# Patient Record
Sex: Female | Born: 1950 | Race: White | Hispanic: No | Marital: Single
Health system: Southern US, Community
[De-identification: ages and names within clinical notes are randomized; demographics above are authoritative.]

## PROBLEM LIST (undated history)

## (undated) DIAGNOSIS — F32A Depression, unspecified: Secondary | ICD-10-CM

## (undated) DIAGNOSIS — I10 Essential (primary) hypertension: Secondary | ICD-10-CM

## (undated) DIAGNOSIS — J449 Chronic obstructive pulmonary disease, unspecified: Secondary | ICD-10-CM

## (undated) DIAGNOSIS — J45909 Unspecified asthma, uncomplicated: Secondary | ICD-10-CM

## (undated) DIAGNOSIS — F449 Dissociative and conversion disorder, unspecified: Secondary | ICD-10-CM

## (undated) DIAGNOSIS — E669 Obesity, unspecified: Secondary | ICD-10-CM

## (undated) DIAGNOSIS — F419 Anxiety disorder, unspecified: Secondary | ICD-10-CM

## (undated) DIAGNOSIS — I2699 Other pulmonary embolism without acute cor pulmonale: Secondary | ICD-10-CM

## (undated) DIAGNOSIS — G2581 Restless legs syndrome: Secondary | ICD-10-CM

## (undated) DIAGNOSIS — E785 Hyperlipidemia, unspecified: Secondary | ICD-10-CM

## (undated) DIAGNOSIS — F329 Major depressive disorder, single episode, unspecified: Secondary | ICD-10-CM

## (undated) DIAGNOSIS — Z950 Presence of cardiac pacemaker: Secondary | ICD-10-CM

## (undated) DIAGNOSIS — D649 Anemia, unspecified: Secondary | ICD-10-CM

## (undated) DIAGNOSIS — I9589 Other hypotension: Secondary | ICD-10-CM

## (undated) DIAGNOSIS — M1990 Unspecified osteoarthritis, unspecified site: Secondary | ICD-10-CM

## (undated) DIAGNOSIS — K760 Fatty (change of) liver, not elsewhere classified: Secondary | ICD-10-CM

## (undated) DIAGNOSIS — R269 Unspecified abnormalities of gait and mobility: Secondary | ICD-10-CM

---

## 2017-08-19 DIAGNOSIS — F418 Other specified anxiety disorders: Secondary | ICD-10-CM | POA: Diagnosis not present

## 2017-08-19 DIAGNOSIS — Z950 Presence of cardiac pacemaker: Secondary | ICD-10-CM | POA: Diagnosis not present

## 2017-08-19 DIAGNOSIS — I10 Essential (primary) hypertension: Secondary | ICD-10-CM

## 2017-08-19 DIAGNOSIS — R000 Tachycardia, unspecified: Secondary | ICD-10-CM | POA: Diagnosis not present

## 2017-08-19 DIAGNOSIS — K219 Gastro-esophageal reflux disease without esophagitis: Secondary | ICD-10-CM | POA: Diagnosis not present

## 2017-08-19 DIAGNOSIS — J449 Chronic obstructive pulmonary disease, unspecified: Secondary | ICD-10-CM

## 2017-08-19 DIAGNOSIS — E785 Hyperlipidemia, unspecified: Secondary | ICD-10-CM | POA: Diagnosis not present

## 2017-08-19 DIAGNOSIS — R55 Syncope and collapse: Secondary | ICD-10-CM | POA: Diagnosis not present

## 2017-08-20 DIAGNOSIS — F418 Other specified anxiety disorders: Secondary | ICD-10-CM | POA: Diagnosis not present

## 2017-08-20 DIAGNOSIS — E785 Hyperlipidemia, unspecified: Secondary | ICD-10-CM | POA: Diagnosis not present

## 2017-08-20 DIAGNOSIS — K219 Gastro-esophageal reflux disease without esophagitis: Secondary | ICD-10-CM | POA: Diagnosis not present

## 2017-08-20 DIAGNOSIS — R55 Syncope and collapse: Secondary | ICD-10-CM

## 2017-08-20 DIAGNOSIS — J449 Chronic obstructive pulmonary disease, unspecified: Secondary | ICD-10-CM | POA: Diagnosis not present

## 2017-08-20 DIAGNOSIS — R000 Tachycardia, unspecified: Secondary | ICD-10-CM | POA: Diagnosis not present

## 2017-08-20 DIAGNOSIS — I10 Essential (primary) hypertension: Secondary | ICD-10-CM | POA: Diagnosis not present

## 2017-08-20 DIAGNOSIS — I4892 Unspecified atrial flutter: Secondary | ICD-10-CM

## 2017-08-20 DIAGNOSIS — Z950 Presence of cardiac pacemaker: Secondary | ICD-10-CM | POA: Diagnosis not present

## 2017-08-21 DIAGNOSIS — Z950 Presence of cardiac pacemaker: Secondary | ICD-10-CM | POA: Diagnosis not present

## 2017-08-21 DIAGNOSIS — K219 Gastro-esophageal reflux disease without esophagitis: Secondary | ICD-10-CM | POA: Diagnosis not present

## 2017-08-21 DIAGNOSIS — J449 Chronic obstructive pulmonary disease, unspecified: Secondary | ICD-10-CM | POA: Diagnosis not present

## 2017-08-21 DIAGNOSIS — F418 Other specified anxiety disorders: Secondary | ICD-10-CM | POA: Diagnosis not present

## 2017-08-21 DIAGNOSIS — R000 Tachycardia, unspecified: Secondary | ICD-10-CM | POA: Diagnosis not present

## 2017-08-21 DIAGNOSIS — E785 Hyperlipidemia, unspecified: Secondary | ICD-10-CM

## 2017-08-21 DIAGNOSIS — I4892 Unspecified atrial flutter: Secondary | ICD-10-CM | POA: Diagnosis not present

## 2017-08-21 DIAGNOSIS — R55 Syncope and collapse: Secondary | ICD-10-CM | POA: Diagnosis not present

## 2017-08-21 DIAGNOSIS — N189 Chronic kidney disease, unspecified: Secondary | ICD-10-CM

## 2017-08-21 DIAGNOSIS — I10 Essential (primary) hypertension: Secondary | ICD-10-CM

## 2017-08-22 DIAGNOSIS — I4892 Unspecified atrial flutter: Secondary | ICD-10-CM | POA: Diagnosis not present

## 2017-08-22 DIAGNOSIS — F418 Other specified anxiety disorders: Secondary | ICD-10-CM | POA: Diagnosis not present

## 2017-08-22 DIAGNOSIS — I10 Essential (primary) hypertension: Secondary | ICD-10-CM | POA: Diagnosis not present

## 2017-08-22 DIAGNOSIS — R000 Tachycardia, unspecified: Secondary | ICD-10-CM | POA: Diagnosis not present

## 2017-08-22 DIAGNOSIS — J449 Chronic obstructive pulmonary disease, unspecified: Secondary | ICD-10-CM | POA: Diagnosis not present

## 2017-08-22 DIAGNOSIS — E785 Hyperlipidemia, unspecified: Secondary | ICD-10-CM | POA: Diagnosis not present

## 2017-08-22 DIAGNOSIS — R55 Syncope and collapse: Secondary | ICD-10-CM | POA: Diagnosis not present

## 2017-08-22 DIAGNOSIS — K219 Gastro-esophageal reflux disease without esophagitis: Secondary | ICD-10-CM | POA: Diagnosis not present

## 2017-08-22 DIAGNOSIS — N189 Chronic kidney disease, unspecified: Secondary | ICD-10-CM | POA: Diagnosis not present

## 2017-08-22 DIAGNOSIS — Z950 Presence of cardiac pacemaker: Secondary | ICD-10-CM | POA: Diagnosis not present

## 2017-08-23 DIAGNOSIS — I4892 Unspecified atrial flutter: Secondary | ICD-10-CM | POA: Diagnosis not present

## 2017-08-23 DIAGNOSIS — E785 Hyperlipidemia, unspecified: Secondary | ICD-10-CM | POA: Diagnosis not present

## 2017-08-23 DIAGNOSIS — I10 Essential (primary) hypertension: Secondary | ICD-10-CM | POA: Diagnosis not present

## 2017-08-23 DIAGNOSIS — R55 Syncope and collapse: Secondary | ICD-10-CM | POA: Diagnosis not present

## 2017-08-23 DIAGNOSIS — K219 Gastro-esophageal reflux disease without esophagitis: Secondary | ICD-10-CM | POA: Diagnosis not present

## 2017-08-23 DIAGNOSIS — J449 Chronic obstructive pulmonary disease, unspecified: Secondary | ICD-10-CM | POA: Diagnosis not present

## 2017-08-23 DIAGNOSIS — F418 Other specified anxiety disorders: Secondary | ICD-10-CM | POA: Diagnosis not present

## 2017-08-23 DIAGNOSIS — Z950 Presence of cardiac pacemaker: Secondary | ICD-10-CM | POA: Diagnosis not present

## 2017-08-23 DIAGNOSIS — R000 Tachycardia, unspecified: Secondary | ICD-10-CM | POA: Diagnosis not present

## 2017-10-20 ENCOUNTER — Encounter (HOSPITAL_COMMUNITY): Payer: Self-pay | Admitting: Emergency Medicine

## 2017-10-20 ENCOUNTER — Emergency Department (HOSPITAL_COMMUNITY)
Admission: EM | Admit: 2017-10-20 | Discharge: 2017-10-21 | Disposition: A | Discharge status: Home / Self Care | Payer: Medicare Other | Attending: Emergency Medicine | Admitting: Emergency Medicine

## 2017-10-20 DIAGNOSIS — A084 Viral intestinal infection, unspecified: Secondary | ICD-10-CM

## 2017-10-20 DIAGNOSIS — F419 Anxiety disorder, unspecified: Secondary | ICD-10-CM | POA: Insufficient documentation

## 2017-10-20 DIAGNOSIS — E876 Hypokalemia: Secondary | ICD-10-CM | POA: Diagnosis not present

## 2017-10-20 DIAGNOSIS — I10 Essential (primary) hypertension: Secondary | ICD-10-CM | POA: Diagnosis not present

## 2017-10-20 DIAGNOSIS — J449 Chronic obstructive pulmonary disease, unspecified: Secondary | ICD-10-CM | POA: Diagnosis not present

## 2017-10-20 DIAGNOSIS — J45909 Unspecified asthma, uncomplicated: Secondary | ICD-10-CM | POA: Diagnosis not present

## 2017-10-20 DIAGNOSIS — F329 Major depressive disorder, single episode, unspecified: Secondary | ICD-10-CM | POA: Insufficient documentation

## 2017-10-20 DIAGNOSIS — Z950 Presence of cardiac pacemaker: Secondary | ICD-10-CM | POA: Insufficient documentation

## 2017-10-20 DIAGNOSIS — R109 Unspecified abdominal pain: Secondary | ICD-10-CM | POA: Diagnosis present

## 2017-10-20 HISTORY — DX: Other hypotension: I95.89

## 2017-10-20 HISTORY — DX: Restless legs syndrome: G25.81

## 2017-10-20 HISTORY — DX: Obesity, unspecified: E66.9

## 2017-10-20 HISTORY — DX: Anemia, unspecified: D64.9

## 2017-10-20 HISTORY — DX: Presence of cardiac pacemaker: Z95.0

## 2017-10-20 HISTORY — DX: Dissociative and conversion disorder, unspecified: F44.9

## 2017-10-20 HISTORY — DX: Other pulmonary embolism without acute cor pulmonale: I26.99

## 2017-10-20 HISTORY — DX: Essential (primary) hypertension: I10

## 2017-10-20 HISTORY — DX: Unspecified asthma, uncomplicated: J45.909

## 2017-10-20 HISTORY — DX: Unspecified abnormalities of gait and mobility: R26.9

## 2017-10-20 HISTORY — DX: Anxiety disorder, unspecified: F41.9

## 2017-10-20 HISTORY — DX: Depression, unspecified: F32.A

## 2017-10-20 HISTORY — DX: Chronic obstructive pulmonary disease, unspecified: J44.9

## 2017-10-20 HISTORY — DX: Hyperlipidemia, unspecified: E78.5

## 2017-10-20 HISTORY — DX: Fatty (change of) liver, not elsewhere classified: K76.0

## 2017-10-20 HISTORY — DX: Unspecified osteoarthritis, unspecified site: M19.90

## 2017-10-20 HISTORY — DX: Major depressive disorder, single episode, unspecified: F32.9

## 2017-10-20 NOTE — ED Triage Notes (Signed)
[facility], called out for abd pain N/V X1 day. Pt states she has been throwing up blood. [facility]. Given Zofran ODT  Pt also states her brief has not been changed the entire day, pt has several raw spots on her bottom that are draining serosanguineous fluid.

## 2017-10-21 ENCOUNTER — Emergency Department (HOSPITAL_COMMUNITY): Payer: Medicare Other

## 2017-10-21 DIAGNOSIS — A084 Viral intestinal infection, unspecified: Secondary | ICD-10-CM | POA: Diagnosis not present

## 2017-10-21 LAB — URINALYSIS, ROUTINE W REFLEX MICROSCOPIC
BILIRUBIN URINE: NEGATIVE
Bacteria, UA: NONE SEEN
GLUCOSE, UA: NEGATIVE mg/dL
Hgb urine dipstick: NEGATIVE
KETONES UR: 5 mg/dL — AB
LEUKOCYTES UA: NEGATIVE
NITRITE: NEGATIVE
PH: 5 (ref 5.0–8.0)
Protein, ur: NEGATIVE mg/dL
SPECIFIC GRAVITY, URINE: 1.03 (ref 1.005–1.030)
SQUAMOUS EPITHELIAL / LPF: NONE SEEN

## 2017-10-21 LAB — CBC
HCT: 34.8 % — ABNORMAL LOW (ref 36.0–46.0)
Hemoglobin: 11.8 g/dL — ABNORMAL LOW (ref 12.0–15.0)
MCH: 33 pg (ref 26.0–34.0)
MCHC: 33.9 g/dL (ref 30.0–36.0)
MCV: 97.2 fL (ref 78.0–100.0)
PLATELETS: 152 10*3/uL (ref 150–400)
RBC: 3.58 MIL/uL — AB (ref 3.87–5.11)
RDW: 16.1 % — AB (ref 11.5–15.5)
WBC: 5.1 10*3/uL (ref 4.0–10.5)

## 2017-10-21 LAB — COMPREHENSIVE METABOLIC PANEL
ALK PHOS: 70 U/L (ref 38–126)
ALT: 29 U/L (ref 14–54)
AST: 37 U/L (ref 15–41)
Albumin: 3.2 g/dL — ABNORMAL LOW (ref 3.5–5.0)
Anion gap: 13 (ref 5–15)
BILIRUBIN TOTAL: 1.2 mg/dL (ref 0.3–1.2)
BUN: 20 mg/dL (ref 6–20)
CALCIUM: 8.9 mg/dL (ref 8.9–10.3)
CHLORIDE: 106 mmol/L (ref 101–111)
CO2: 22 mmol/L (ref 22–32)
CREATININE: 1.08 mg/dL — AB (ref 0.44–1.00)
GFR, EST NON AFRICAN AMERICAN: 52 mL/min — AB (ref >60)
Glucose, Bld: 92 mg/dL (ref 65–99)
Potassium: 2.5 mmol/L — CL (ref 3.5–5.1)
Sodium: 141 mmol/L (ref 135–145)
TOTAL PROTEIN: 6.6 g/dL (ref 6.5–8.1)

## 2017-10-21 LAB — TYPE AND SCREEN
ABO/RH(D): A NEG
ANTIBODY SCREEN: NEGATIVE

## 2017-10-21 LAB — ABO/RH: ABO/RH(D): A NEG

## 2017-10-21 LAB — PROTIME-INR
INR: 1.69
Prothrombin Time: 19.8 seconds — ABNORMAL HIGH (ref 11.4–15.2)

## 2017-10-21 LAB — LIPASE, BLOOD: LIPASE: 58 U/L — AB (ref 11–51)

## 2017-10-21 LAB — APTT: APTT: 30 s (ref 24–36)

## 2017-10-21 LAB — MAGNESIUM: Magnesium: 1.9 mg/dL (ref 1.7–2.4)

## 2017-10-21 MED ORDER — LOPERAMIDE HCL 2 MG PO CAPS: 2.0000 mg | ORAL_CAPSULE | Freq: Four times a day (QID) | ORAL | 0 refills | Status: AC | PRN

## 2017-10-21 MED ORDER — ONDANSETRON HCL 4 MG/2ML IJ SOLN
4.0000 mg | Freq: Once | INTRAMUSCULAR | Status: AC
  Administered 2017-10-21: 4 mg via INTRAVENOUS
  Filled 2017-10-21: qty 2

## 2017-10-21 MED ORDER — POTASSIUM CHLORIDE CRYS ER 20 MEQ PO TBCR
40.0000 meq | EXTENDED_RELEASE_TABLET | Freq: Once | ORAL | Status: AC
  Administered 2017-10-21: 40 meq via ORAL
  Filled 2017-10-21: qty 2

## 2017-10-21 MED ORDER — POTASSIUM CHLORIDE 10 MEQ/100ML IV SOLN
10.0000 meq | INTRAVENOUS | Status: AC
Start: 2017-10-21 — End: 2017-10-21
  Administered 2017-10-21 (×4): 10 meq via INTRAVENOUS
  Filled 2017-10-21 (×2): qty 100

## 2017-10-21 MED ORDER — PANTOPRAZOLE SODIUM 40 MG IV SOLR
40.0000 mg | Freq: Once | INTRAVENOUS | Status: AC
  Administered 2017-10-21: 40 mg via INTRAVENOUS
  Filled 2017-10-21: qty 40

## 2017-10-21 MED ORDER — ONDANSETRON 4 MG PO TBDP
4.0000 mg | ORAL_TABLET | Freq: Once | ORAL | Status: AC
  Administered 2017-10-21: 4 mg via ORAL
  Filled 2017-10-21: qty 1

## 2017-10-21 MED ORDER — FENTANYL CITRATE (PF) 100 MCG/2ML IJ SOLN
50.0000 ug | Freq: Once | INTRAMUSCULAR | Status: AC
  Administered 2017-10-21: 50 ug via INTRAVENOUS
  Filled 2017-10-21: qty 2

## 2017-10-21 MED ORDER — SODIUM CHLORIDE 0.9 % IV BOLUS (SEPSIS)
500.0000 mL | Freq: Once | INTRAVENOUS | Status: AC
  Administered 2017-10-21: 500 mL via INTRAVENOUS

## 2017-10-21 MED ORDER — ONDANSETRON 4 MG PO TBDP: 4.0000 mg | ORAL_TABLET | Freq: Three times a day (TID) | ORAL | 0 refills | Status: AC | PRN

## 2017-10-21 MED ORDER — IOPAMIDOL (ISOVUE-300) INJECTION 61%
INTRAVENOUS | Status: AC
  Administered 2017-10-21: 100 mL
  Filled 2017-10-21: qty 100

## 2017-10-21 NOTE — ED Notes (Signed)
[facility] facility will pick pt up in wheelchair Zenaida Niecevan

## 2017-10-21 NOTE — ED Notes (Signed)
Pt was able to keep down po fluids without difficulty

## 2017-10-21 NOTE — ED Notes (Signed)
Attempted to call [facility] multiple times to give report with no answer, will continue to call.

## 2017-10-21 NOTE — ED Provider Notes (Signed)
TIME SEEN: 12:13 AM  CHIEF COMPLAINT: Abdominal pain  HPI: Patient is a 66 year old female with history of hypertension, hyperlipidemia, PE on Xarelto, pacemaker, COPD who lives at [facility] assisted living facility who presents to the emergency department with diffuse sharp abdominal pain and vomiting with streaks of blood and small clots of blood for the past day.  Also has had diarrhea but no bloody stools or melena.  Denies previous abdominal surgery.  States that [place].  No local GI physician.  Her primary care physician is Dr. Sheppard PentonWolf.  ROS: See HPI Constitutional: no fever  Eyes: no drainage  ENT: no runny nose   Cardiovascular:  no chest pain  Resp: no SOB  GI:  vomiting GU: no dysuria Integumentary: no rash  Allergy: no hives  Musculoskeletal: no leg swelling  Neurological: no slurred speech ROS otherwise negative  PAST MEDICAL HISTORY/PAST SURGICAL HISTORY:  Past Medical History:  Diagnosis Date  . Anemia   . Anxiety   . Arthritis   . Asthma   . Chronic hypotension   . Conversion disorder   . COPD (chronic obstructive pulmonary disease) (HCC)   . Depression   . Depressive disorder   . Fatty liver   . Gait disturbance   . Hyperlipidemia   . Hypertension   . Obesity   . Pacemaker   . Pulmonary emboli (HCC)   . Restless leg     MEDICATIONS:  Prior to Admission medications   Not on File    ALLERGIES:  Allergies  Allergen Reactions  . Asa [Aspirin]   . Celexa [Citalopram Hydrobromide]   . Morphine And Related   . Nsaids   . Omnicef [Cefdinir]   . Penicillins   . Tylenol [Acetaminophen]   . Vicodin [Hydrocodone-Acetaminophen]     SOCIAL HISTORY:  Social History   Tobacco Use  . Smoking status: Not on file  Substance Use Topics  . Alcohol use: Not on file    FAMILY HISTORY: No family history on file.  EXAM: BP (!) 143/77 (BP Location: Left Arm)   Pulse 68   Temp (!) 97.2 F (36.2 C)  (Oral)   Resp (!) 21   SpO2 100%  CONSTITUTIONAL: Alert and oriented and responds appropriately to questions.  Morbidly obese, chronically ill-appearing, afebrile, extremely poor historian HEAD: Normocephalic EYES: Conjunctivae clear, pupils appear equal, EOMI ENT: normal nose; dry mucous membranes NECK: Supple, no meningismus, no nuchal rigidity, no LAD  CARD: RRR; S1 and S2 appreciated; no murmurs, no clicks, no rubs, no gallops RESP: Normal chest excursion without splinting or tachypnea; breath sounds clear and equal bilaterally; no wheezes, no rhonchi, no rales, no hypoxia or respiratory distress, speaking full sentences ABD/GI: Normal bowel sounds; non-distended; soft, diffusely tender throughout the abdomen mostly in the upper abdomen, no rebound, no guarding, no peritoneal signs, no hepatosplenomegaly BACK:  The back appears normal and is non-tender to palpation, there is no CVA tenderness EXT: Normal ROM in all joints; non-tender to palpation; no edema; normal capillary refill; no cyanosis, no calf tenderness or swelling    SKIN: Normal color for age and race; warm; no rash NEURO: Moves all extremities equally PSYCH: The patient's mood and manner are appropriate. Grooming and personal hygiene are appropriate.  MEDICAL DECISION MAKING: Patient here with hematemesis and abdominal pain.  Differential diagnosis includes gastritis, ulcer, varices, colitis, less likely diverticulitis or appendicitis.  Will obtain labs, urine and a CT of her abdomen  pelvis.  If patient vomits again we will send this to check for blood.  Will give IV fluids, pain and nausea medicine.  Will give Protonix.  ED PROGRESS: Per nurse patient has not been vomiting but rather coughing.  Cough looks like clear sputum without blood.  Will add on a chest x-ray.  Chest x-ray clear.  CT scan shows diffuse enlargement of the colon which is fluid-filled suggesting ileus versus diarrheal illness.  No colitis or obstruction.   She has not vomited any blood here in the emergency department.  Labs show potassium of 2.5.  EKG shows no interval changes.  Will give oral and IV replacement.  Will check magnesium level.   Magnesium level normal.  Urine shows no sign of infection and only small ketones but she has received IV hydration.  Patient received 40 mEq of IV potassium and 40 mEq of oral potassium.  Will discharge with oral potassium and have her follow-up with her PCP to have this rechecked in 1 week.  She has not had any vomiting or diarrhea here in the emergency department.  We have not seen any hematemesis or melena.  I feel this is likely a viral gastroenteritis.  I do not feel she needs admission.  I feel she is safe to be discharged back to her nursing facility.   At this time, I do not feel there is any life-threatening condition present. I have reviewed and discussed all results (EKG, imaging, lab, urine as appropriate) and exam findings with patient/family. I have reviewed nursing notes and appropriate previous records.  I feel the patient is safe to be discharged home without further emergent workup and can continue workup as an outpatient as needed. Discussed usual and customary return precautions. Patient/family verbalize understanding and are comfortable with this plan.  Outpatient follow-up has been provided if needed. All questions have been answered.    EKG Interpretation  Date/Time:  Thursday October 21 2017 02:46:12 EST Ventricular Rate:  89 PR Interval:    QRS Duration: 118 QT Interval:  392 QTC Calculation: 477 R Axis:   -78 Text Interpretation:  Atrial fibrillation versus artifact Paired ventricular premature complexes Left anterior fascicular block LVH with secondary repolarization abnormality No old tracing to compare Confirmed by Ward, Baxter HireKristen [phone]([id number]) on 10/21/2017 3:03:07 AM          Ward, Layla MawKristen N, DO 10/21/17 0530

## 2017-10-21 NOTE — ED Notes (Signed)
[facility] of [facility] Zenaida Niecevan to pickup patient.  [facility] states Zenaida Niecevan is en route.

## 2017-10-21 NOTE — ED Notes (Signed)
Pt unable to tolerate po meds, pt tried with applesauce and was unable to keep pills down

## 2017-11-04 DIAGNOSIS — F418 Other specified anxiety disorders: Secondary | ICD-10-CM | POA: Diagnosis not present

## 2017-11-04 DIAGNOSIS — J449 Chronic obstructive pulmonary disease, unspecified: Secondary | ICD-10-CM | POA: Diagnosis not present

## 2017-11-04 DIAGNOSIS — E876 Hypokalemia: Secondary | ICD-10-CM | POA: Diagnosis not present

## 2017-11-04 DIAGNOSIS — F329 Major depressive disorder, single episode, unspecified: Secondary | ICD-10-CM

## 2017-11-04 DIAGNOSIS — I10 Essential (primary) hypertension: Secondary | ICD-10-CM | POA: Diagnosis not present

## 2017-11-04 DIAGNOSIS — F419 Anxiety disorder, unspecified: Secondary | ICD-10-CM

## 2017-11-04 DIAGNOSIS — K219 Gastro-esophageal reflux disease without esophagitis: Secondary | ICD-10-CM | POA: Diagnosis not present

## 2017-11-04 DIAGNOSIS — R748 Abnormal levels of other serum enzymes: Secondary | ICD-10-CM

## 2017-11-04 DIAGNOSIS — E785 Hyperlipidemia, unspecified: Secondary | ICD-10-CM | POA: Diagnosis not present

## 2017-11-04 DIAGNOSIS — Z950 Presence of cardiac pacemaker: Secondary | ICD-10-CM | POA: Diagnosis not present

## 2017-11-04 DIAGNOSIS — N179 Acute kidney failure, unspecified: Secondary | ICD-10-CM | POA: Diagnosis not present

## 2017-11-04 DIAGNOSIS — N390 Urinary tract infection, site not specified: Secondary | ICD-10-CM | POA: Diagnosis not present

## 2017-11-04 DIAGNOSIS — I4892 Unspecified atrial flutter: Secondary | ICD-10-CM | POA: Diagnosis not present

## 2017-11-04 DIAGNOSIS — E039 Hypothyroidism, unspecified: Secondary | ICD-10-CM

## 2017-11-05 DIAGNOSIS — K219 Gastro-esophageal reflux disease without esophagitis: Secondary | ICD-10-CM | POA: Diagnosis not present

## 2017-11-05 DIAGNOSIS — Z950 Presence of cardiac pacemaker: Secondary | ICD-10-CM | POA: Diagnosis not present

## 2017-11-05 DIAGNOSIS — N390 Urinary tract infection, site not specified: Secondary | ICD-10-CM | POA: Diagnosis not present

## 2017-11-05 DIAGNOSIS — R748 Abnormal levels of other serum enzymes: Secondary | ICD-10-CM | POA: Diagnosis not present

## 2017-11-05 DIAGNOSIS — F418 Other specified anxiety disorders: Secondary | ICD-10-CM | POA: Diagnosis not present

## 2017-11-05 DIAGNOSIS — I4892 Unspecified atrial flutter: Secondary | ICD-10-CM | POA: Diagnosis not present

## 2017-11-05 DIAGNOSIS — F329 Major depressive disorder, single episode, unspecified: Secondary | ICD-10-CM | POA: Diagnosis not present

## 2017-11-05 DIAGNOSIS — E785 Hyperlipidemia, unspecified: Secondary | ICD-10-CM | POA: Diagnosis not present

## 2017-11-05 DIAGNOSIS — E039 Hypothyroidism, unspecified: Secondary | ICD-10-CM | POA: Diagnosis not present

## 2017-11-05 DIAGNOSIS — F419 Anxiety disorder, unspecified: Secondary | ICD-10-CM | POA: Diagnosis not present

## 2017-11-05 DIAGNOSIS — J449 Chronic obstructive pulmonary disease, unspecified: Secondary | ICD-10-CM | POA: Diagnosis not present

## 2017-11-05 DIAGNOSIS — I10 Essential (primary) hypertension: Secondary | ICD-10-CM | POA: Diagnosis not present

## 2017-11-06 DIAGNOSIS — K219 Gastro-esophageal reflux disease without esophagitis: Secondary | ICD-10-CM | POA: Diagnosis not present

## 2017-11-06 DIAGNOSIS — I4892 Unspecified atrial flutter: Secondary | ICD-10-CM | POA: Diagnosis not present

## 2017-11-06 DIAGNOSIS — E039 Hypothyroidism, unspecified: Secondary | ICD-10-CM | POA: Diagnosis not present

## 2017-11-06 DIAGNOSIS — F329 Major depressive disorder, single episode, unspecified: Secondary | ICD-10-CM | POA: Diagnosis not present

## 2017-11-06 DIAGNOSIS — R748 Abnormal levels of other serum enzymes: Secondary | ICD-10-CM | POA: Diagnosis not present

## 2017-11-06 DIAGNOSIS — I10 Essential (primary) hypertension: Secondary | ICD-10-CM | POA: Diagnosis not present

## 2017-11-06 DIAGNOSIS — J449 Chronic obstructive pulmonary disease, unspecified: Secondary | ICD-10-CM | POA: Diagnosis not present

## 2017-11-06 DIAGNOSIS — F418 Other specified anxiety disorders: Secondary | ICD-10-CM | POA: Diagnosis not present

## 2017-11-06 DIAGNOSIS — N390 Urinary tract infection, site not specified: Secondary | ICD-10-CM | POA: Diagnosis not present

## 2017-11-06 DIAGNOSIS — F419 Anxiety disorder, unspecified: Secondary | ICD-10-CM | POA: Diagnosis not present

## 2017-11-06 DIAGNOSIS — E785 Hyperlipidemia, unspecified: Secondary | ICD-10-CM | POA: Diagnosis not present

## 2017-11-06 DIAGNOSIS — Z950 Presence of cardiac pacemaker: Secondary | ICD-10-CM | POA: Diagnosis not present

## 2017-11-07 DIAGNOSIS — R748 Abnormal levels of other serum enzymes: Secondary | ICD-10-CM | POA: Diagnosis not present

## 2017-11-07 DIAGNOSIS — I10 Essential (primary) hypertension: Secondary | ICD-10-CM | POA: Diagnosis not present

## 2017-11-07 DIAGNOSIS — F419 Anxiety disorder, unspecified: Secondary | ICD-10-CM | POA: Diagnosis not present

## 2017-11-07 DIAGNOSIS — K219 Gastro-esophageal reflux disease without esophagitis: Secondary | ICD-10-CM | POA: Diagnosis not present

## 2017-11-07 DIAGNOSIS — N390 Urinary tract infection, site not specified: Secondary | ICD-10-CM | POA: Diagnosis not present

## 2017-11-07 DIAGNOSIS — Z950 Presence of cardiac pacemaker: Secondary | ICD-10-CM | POA: Diagnosis not present

## 2017-11-07 DIAGNOSIS — F329 Major depressive disorder, single episode, unspecified: Secondary | ICD-10-CM | POA: Diagnosis not present

## 2017-11-07 DIAGNOSIS — J449 Chronic obstructive pulmonary disease, unspecified: Secondary | ICD-10-CM | POA: Diagnosis not present

## 2017-11-07 DIAGNOSIS — E785 Hyperlipidemia, unspecified: Secondary | ICD-10-CM | POA: Diagnosis not present

## 2017-11-07 DIAGNOSIS — F418 Other specified anxiety disorders: Secondary | ICD-10-CM | POA: Diagnosis not present

## 2017-11-07 DIAGNOSIS — E039 Hypothyroidism, unspecified: Secondary | ICD-10-CM | POA: Diagnosis not present

## 2017-11-08 DIAGNOSIS — E039 Hypothyroidism, unspecified: Secondary | ICD-10-CM | POA: Diagnosis not present

## 2017-11-08 DIAGNOSIS — E785 Hyperlipidemia, unspecified: Secondary | ICD-10-CM | POA: Diagnosis not present

## 2017-11-08 DIAGNOSIS — N390 Urinary tract infection, site not specified: Secondary | ICD-10-CM | POA: Diagnosis not present

## 2017-11-08 DIAGNOSIS — F329 Major depressive disorder, single episode, unspecified: Secondary | ICD-10-CM | POA: Diagnosis not present

## 2017-11-08 DIAGNOSIS — K219 Gastro-esophageal reflux disease without esophagitis: Secondary | ICD-10-CM | POA: Diagnosis not present

## 2017-11-08 DIAGNOSIS — I10 Essential (primary) hypertension: Secondary | ICD-10-CM | POA: Diagnosis not present

## 2017-11-08 DIAGNOSIS — F419 Anxiety disorder, unspecified: Secondary | ICD-10-CM | POA: Diagnosis not present

## 2017-11-08 DIAGNOSIS — J449 Chronic obstructive pulmonary disease, unspecified: Secondary | ICD-10-CM | POA: Diagnosis not present

## 2017-11-08 DIAGNOSIS — F418 Other specified anxiety disorders: Secondary | ICD-10-CM | POA: Diagnosis not present

## 2017-11-08 DIAGNOSIS — Z950 Presence of cardiac pacemaker: Secondary | ICD-10-CM | POA: Diagnosis not present

## 2017-11-08 DIAGNOSIS — R748 Abnormal levels of other serum enzymes: Secondary | ICD-10-CM | POA: Diagnosis not present

## 2017-11-09 DIAGNOSIS — E785 Hyperlipidemia, unspecified: Secondary | ICD-10-CM | POA: Diagnosis not present

## 2017-11-09 DIAGNOSIS — Z950 Presence of cardiac pacemaker: Secondary | ICD-10-CM | POA: Diagnosis not present

## 2017-11-09 DIAGNOSIS — E039 Hypothyroidism, unspecified: Secondary | ICD-10-CM | POA: Diagnosis not present

## 2017-11-09 DIAGNOSIS — J449 Chronic obstructive pulmonary disease, unspecified: Secondary | ICD-10-CM | POA: Diagnosis not present

## 2017-11-09 DIAGNOSIS — F329 Major depressive disorder, single episode, unspecified: Secondary | ICD-10-CM | POA: Diagnosis not present

## 2017-11-09 DIAGNOSIS — K219 Gastro-esophageal reflux disease without esophagitis: Secondary | ICD-10-CM | POA: Diagnosis not present

## 2017-11-09 DIAGNOSIS — R748 Abnormal levels of other serum enzymes: Secondary | ICD-10-CM | POA: Diagnosis not present

## 2017-11-09 DIAGNOSIS — F418 Other specified anxiety disorders: Secondary | ICD-10-CM | POA: Diagnosis not present

## 2017-11-09 DIAGNOSIS — I10 Essential (primary) hypertension: Secondary | ICD-10-CM | POA: Diagnosis not present

## 2017-11-09 DIAGNOSIS — F419 Anxiety disorder, unspecified: Secondary | ICD-10-CM | POA: Diagnosis not present

## 2017-11-09 DIAGNOSIS — N390 Urinary tract infection, site not specified: Secondary | ICD-10-CM | POA: Diagnosis not present

## 2017-11-10 DIAGNOSIS — E039 Hypothyroidism, unspecified: Secondary | ICD-10-CM | POA: Diagnosis not present

## 2017-11-10 DIAGNOSIS — R748 Abnormal levels of other serum enzymes: Secondary | ICD-10-CM | POA: Diagnosis not present

## 2017-11-10 DIAGNOSIS — F419 Anxiety disorder, unspecified: Secondary | ICD-10-CM | POA: Diagnosis not present

## 2017-11-10 DIAGNOSIS — F329 Major depressive disorder, single episode, unspecified: Secondary | ICD-10-CM | POA: Diagnosis not present

## 2017-11-10 DIAGNOSIS — E785 Hyperlipidemia, unspecified: Secondary | ICD-10-CM | POA: Diagnosis not present

## 2017-11-10 DIAGNOSIS — F418 Other specified anxiety disorders: Secondary | ICD-10-CM | POA: Diagnosis not present

## 2017-11-10 DIAGNOSIS — N390 Urinary tract infection, site not specified: Secondary | ICD-10-CM | POA: Diagnosis not present

## 2017-11-10 DIAGNOSIS — I10 Essential (primary) hypertension: Secondary | ICD-10-CM | POA: Diagnosis not present

## 2017-11-10 DIAGNOSIS — K219 Gastro-esophageal reflux disease without esophagitis: Secondary | ICD-10-CM | POA: Diagnosis not present

## 2017-11-10 DIAGNOSIS — J449 Chronic obstructive pulmonary disease, unspecified: Secondary | ICD-10-CM | POA: Diagnosis not present

## 2017-11-10 DIAGNOSIS — Z950 Presence of cardiac pacemaker: Secondary | ICD-10-CM | POA: Diagnosis not present

## 2017-11-11 DIAGNOSIS — F419 Anxiety disorder, unspecified: Secondary | ICD-10-CM | POA: Diagnosis not present

## 2017-11-11 DIAGNOSIS — J449 Chronic obstructive pulmonary disease, unspecified: Secondary | ICD-10-CM | POA: Diagnosis not present

## 2017-11-11 DIAGNOSIS — I10 Essential (primary) hypertension: Secondary | ICD-10-CM | POA: Diagnosis not present

## 2017-11-11 DIAGNOSIS — E039 Hypothyroidism, unspecified: Secondary | ICD-10-CM | POA: Diagnosis not present

## 2017-11-11 DIAGNOSIS — E785 Hyperlipidemia, unspecified: Secondary | ICD-10-CM | POA: Diagnosis not present

## 2017-11-11 DIAGNOSIS — Z950 Presence of cardiac pacemaker: Secondary | ICD-10-CM | POA: Diagnosis not present

## 2017-11-11 DIAGNOSIS — K219 Gastro-esophageal reflux disease without esophagitis: Secondary | ICD-10-CM | POA: Diagnosis not present

## 2017-11-11 DIAGNOSIS — F329 Major depressive disorder, single episode, unspecified: Secondary | ICD-10-CM | POA: Diagnosis not present

## 2017-11-11 DIAGNOSIS — F418 Other specified anxiety disorders: Secondary | ICD-10-CM | POA: Diagnosis not present

## 2017-11-11 DIAGNOSIS — R748 Abnormal levels of other serum enzymes: Secondary | ICD-10-CM | POA: Diagnosis not present

## 2017-11-11 DIAGNOSIS — N390 Urinary tract infection, site not specified: Secondary | ICD-10-CM | POA: Diagnosis not present

## 2017-11-12 DIAGNOSIS — E039 Hypothyroidism, unspecified: Secondary | ICD-10-CM | POA: Diagnosis not present

## 2017-11-12 DIAGNOSIS — F419 Anxiety disorder, unspecified: Secondary | ICD-10-CM | POA: Diagnosis not present

## 2017-11-12 DIAGNOSIS — I10 Essential (primary) hypertension: Secondary | ICD-10-CM | POA: Diagnosis not present

## 2017-11-12 DIAGNOSIS — F329 Major depressive disorder, single episode, unspecified: Secondary | ICD-10-CM | POA: Diagnosis not present

## 2017-11-12 DIAGNOSIS — R748 Abnormal levels of other serum enzymes: Secondary | ICD-10-CM | POA: Diagnosis not present

## 2017-11-12 DIAGNOSIS — F418 Other specified anxiety disorders: Secondary | ICD-10-CM | POA: Diagnosis not present

## 2017-11-12 DIAGNOSIS — K219 Gastro-esophageal reflux disease without esophagitis: Secondary | ICD-10-CM | POA: Diagnosis not present

## 2017-11-12 DIAGNOSIS — E785 Hyperlipidemia, unspecified: Secondary | ICD-10-CM | POA: Diagnosis not present

## 2017-11-12 DIAGNOSIS — J449 Chronic obstructive pulmonary disease, unspecified: Secondary | ICD-10-CM | POA: Diagnosis not present

## 2017-11-12 DIAGNOSIS — Z950 Presence of cardiac pacemaker: Secondary | ICD-10-CM | POA: Diagnosis not present

## 2017-11-12 DIAGNOSIS — N390 Urinary tract infection, site not specified: Secondary | ICD-10-CM | POA: Diagnosis not present

## 2017-11-13 DIAGNOSIS — E039 Hypothyroidism, unspecified: Secondary | ICD-10-CM | POA: Diagnosis not present

## 2017-11-13 DIAGNOSIS — K219 Gastro-esophageal reflux disease without esophagitis: Secondary | ICD-10-CM | POA: Diagnosis not present

## 2017-11-13 DIAGNOSIS — R748 Abnormal levels of other serum enzymes: Secondary | ICD-10-CM | POA: Diagnosis not present

## 2017-11-13 DIAGNOSIS — N390 Urinary tract infection, site not specified: Secondary | ICD-10-CM | POA: Diagnosis not present

## 2017-11-13 DIAGNOSIS — I10 Essential (primary) hypertension: Secondary | ICD-10-CM | POA: Diagnosis not present

## 2017-11-13 DIAGNOSIS — J449 Chronic obstructive pulmonary disease, unspecified: Secondary | ICD-10-CM | POA: Diagnosis not present

## 2017-11-13 DIAGNOSIS — Z950 Presence of cardiac pacemaker: Secondary | ICD-10-CM | POA: Diagnosis not present

## 2017-11-13 DIAGNOSIS — E785 Hyperlipidemia, unspecified: Secondary | ICD-10-CM | POA: Diagnosis not present

## 2017-11-13 DIAGNOSIS — F419 Anxiety disorder, unspecified: Secondary | ICD-10-CM | POA: Diagnosis not present

## 2017-11-13 DIAGNOSIS — F329 Major depressive disorder, single episode, unspecified: Secondary | ICD-10-CM | POA: Diagnosis not present

## 2017-11-13 DIAGNOSIS — F418 Other specified anxiety disorders: Secondary | ICD-10-CM | POA: Diagnosis not present

## 2017-11-14 DIAGNOSIS — Z950 Presence of cardiac pacemaker: Secondary | ICD-10-CM | POA: Diagnosis not present

## 2017-11-14 DIAGNOSIS — F329 Major depressive disorder, single episode, unspecified: Secondary | ICD-10-CM | POA: Diagnosis not present

## 2017-11-14 DIAGNOSIS — E039 Hypothyroidism, unspecified: Secondary | ICD-10-CM | POA: Diagnosis not present

## 2017-11-14 DIAGNOSIS — E785 Hyperlipidemia, unspecified: Secondary | ICD-10-CM | POA: Diagnosis not present

## 2017-11-14 DIAGNOSIS — N390 Urinary tract infection, site not specified: Secondary | ICD-10-CM | POA: Diagnosis not present

## 2017-11-14 DIAGNOSIS — K219 Gastro-esophageal reflux disease without esophagitis: Secondary | ICD-10-CM | POA: Diagnosis not present

## 2017-11-14 DIAGNOSIS — N179 Acute kidney failure, unspecified: Secondary | ICD-10-CM

## 2017-11-14 DIAGNOSIS — J449 Chronic obstructive pulmonary disease, unspecified: Secondary | ICD-10-CM | POA: Diagnosis not present

## 2017-11-14 DIAGNOSIS — F418 Other specified anxiety disorders: Secondary | ICD-10-CM | POA: Diagnosis not present

## 2017-11-14 DIAGNOSIS — F419 Anxiety disorder, unspecified: Secondary | ICD-10-CM | POA: Diagnosis not present

## 2017-11-14 DIAGNOSIS — R748 Abnormal levels of other serum enzymes: Secondary | ICD-10-CM

## 2017-11-14 DIAGNOSIS — I10 Essential (primary) hypertension: Secondary | ICD-10-CM | POA: Diagnosis not present

## 2017-11-14 DIAGNOSIS — E876 Hypokalemia: Secondary | ICD-10-CM

## 2017-11-15 DIAGNOSIS — R748 Abnormal levels of other serum enzymes: Secondary | ICD-10-CM | POA: Diagnosis not present

## 2017-11-15 DIAGNOSIS — E039 Hypothyroidism, unspecified: Secondary | ICD-10-CM | POA: Diagnosis not present

## 2017-11-15 DIAGNOSIS — F419 Anxiety disorder, unspecified: Secondary | ICD-10-CM | POA: Diagnosis not present

## 2017-11-15 DIAGNOSIS — F329 Major depressive disorder, single episode, unspecified: Secondary | ICD-10-CM | POA: Diagnosis not present

## 2017-12-14 DEATH — deceased

## 2019-03-29 IMAGING — CT CT ABD-PELV W/ CM
2 of 5 series · 16 of 46 positions shown, 18 images · IV contrast (iopamidol)
Comparison: 10/17/2017, 08/19/2017

CLINICAL DATA: Abdominal pain with vomiting

EXAM:
CT ABDOMEN AND PELVIS WITH CONTRAST
TECHNIQUE: Multidetector CT imaging of the abdomen and pelvis was performed
using the standard protocol following bolus administration of
intravenous contrast.
CONTRAST:  100mL 8VR7D2-QGG IOPAMIDOL (8VR7D2-QGG) INJECTION 61%

[Series 3: abdomen 5.0 · axial · 0.84mm/px · z∈[-472,-28]mm · 13 of 101 slices shown, 15 images]
[im 6/101  soft-tissue]
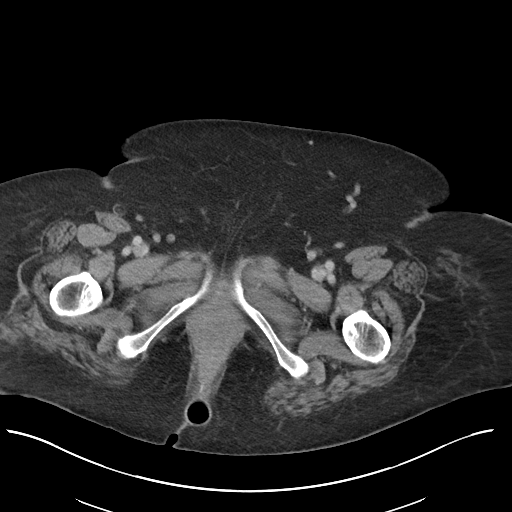
[im 6/101  bone]
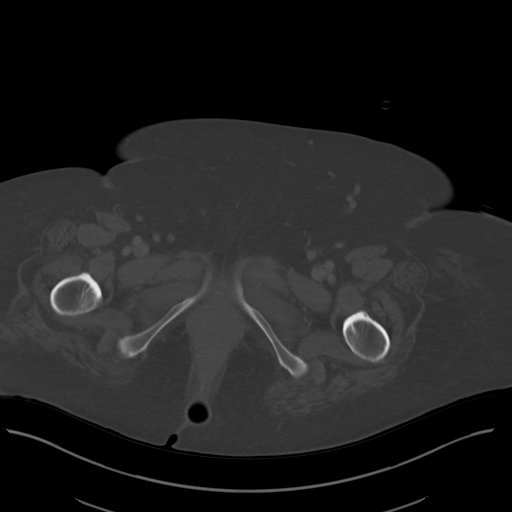
[im 12/101  soft-tissue]
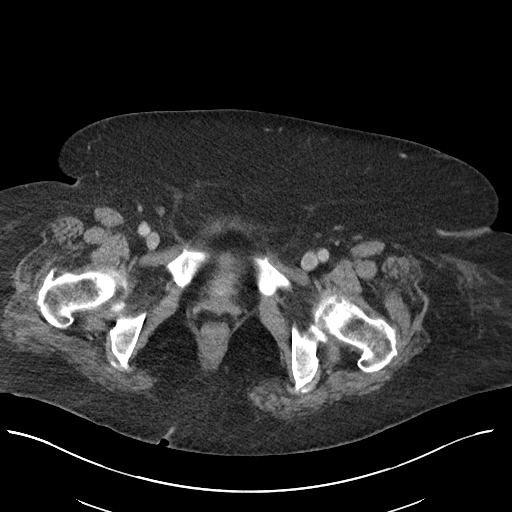
[im 24/101  soft-tissue]
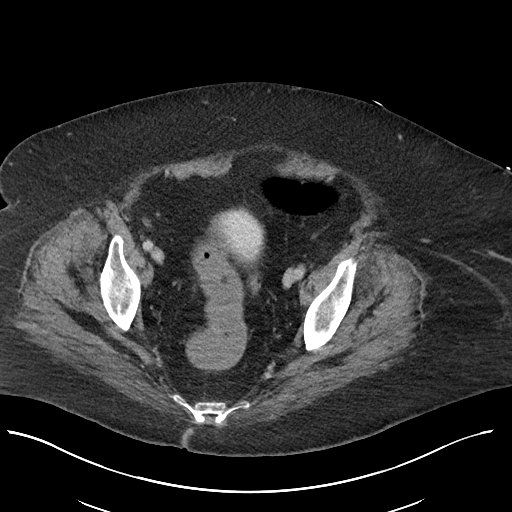
[im 30/101  soft-tissue]
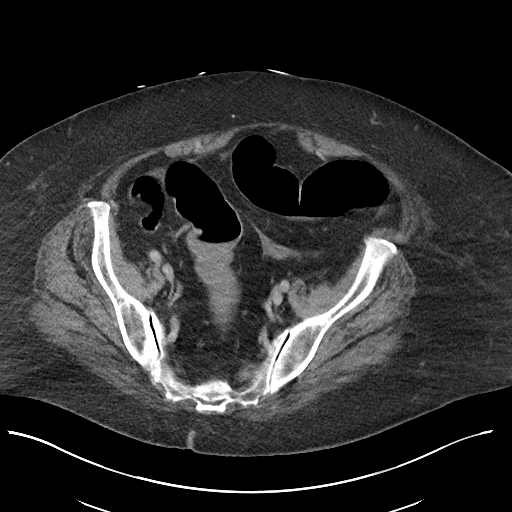
[im 36/101  soft-tissue]
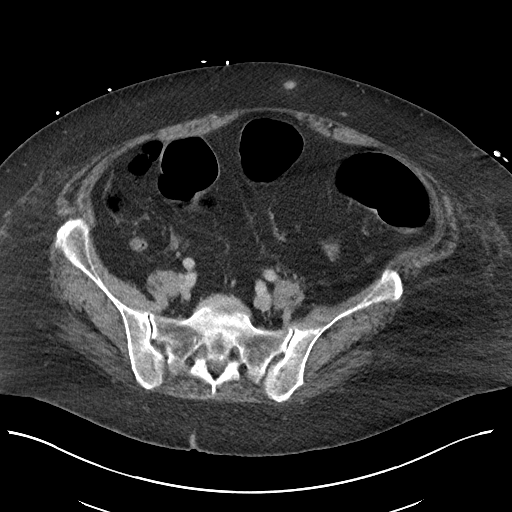
[im 42/101  soft-tissue]
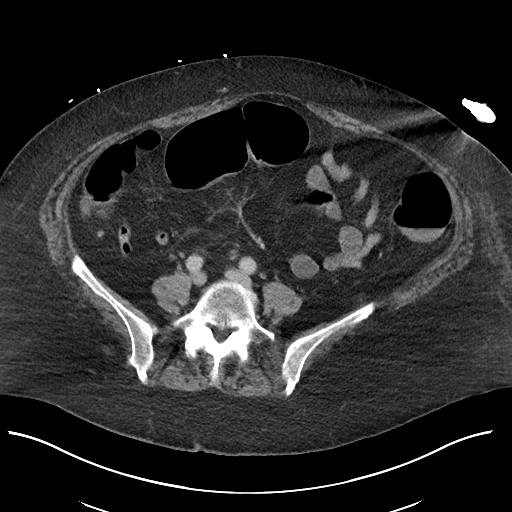
[im 53/101  soft-tissue]
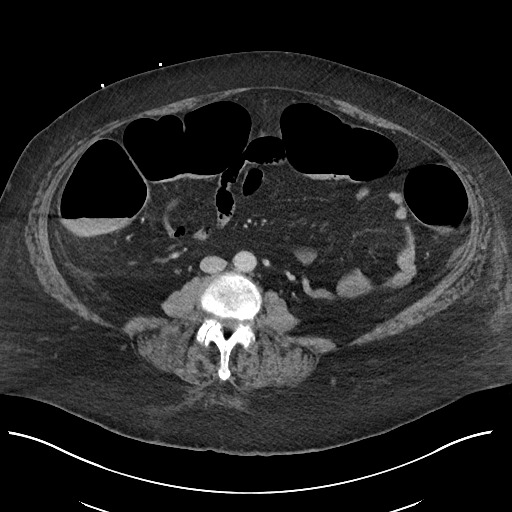
[im 59/101  soft-tissue]
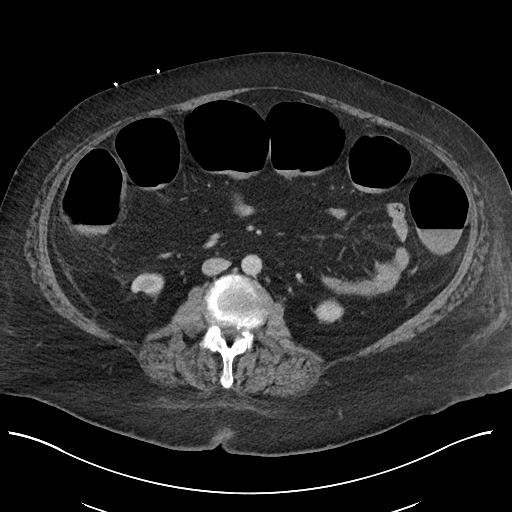
[im 65/101  soft-tissue]
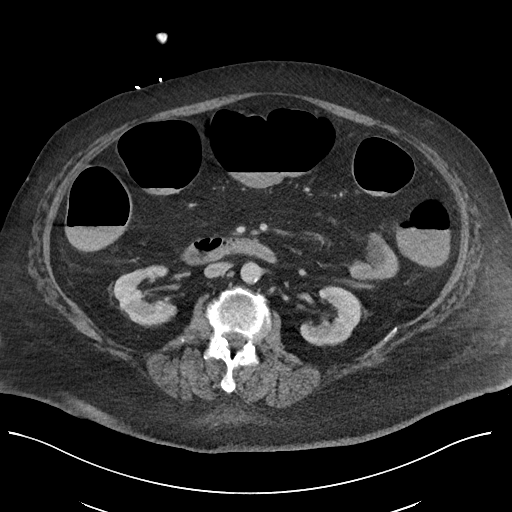
[im 65/101  bone]
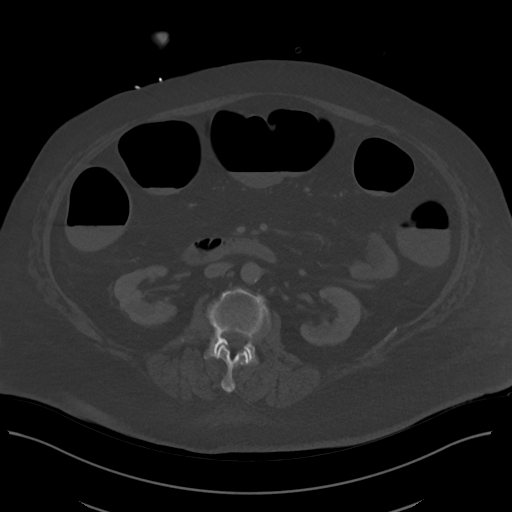
[im 71/101  soft-tissue]
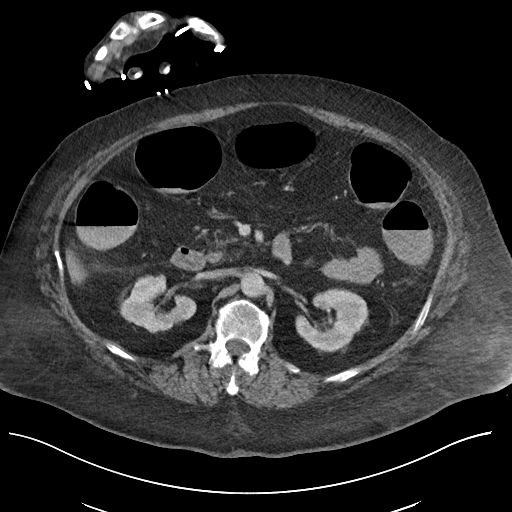
[im 77/101  soft-tissue]
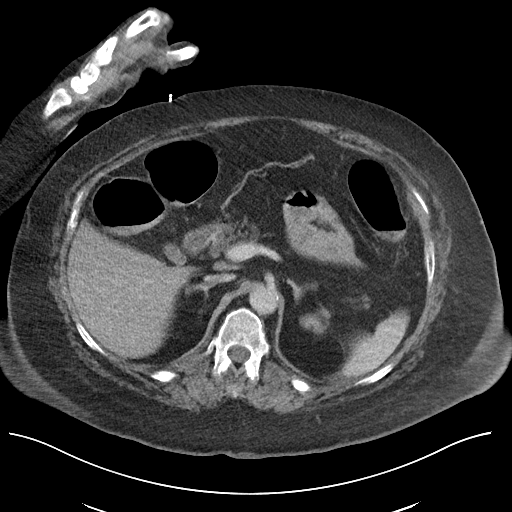
[im 89/101  soft-tissue]
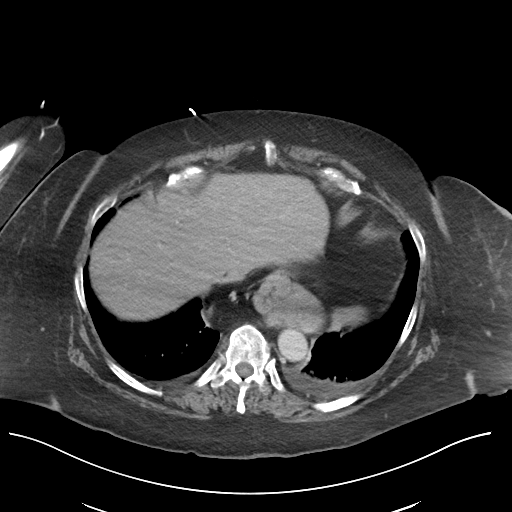
[im 95/101  soft-tissue]
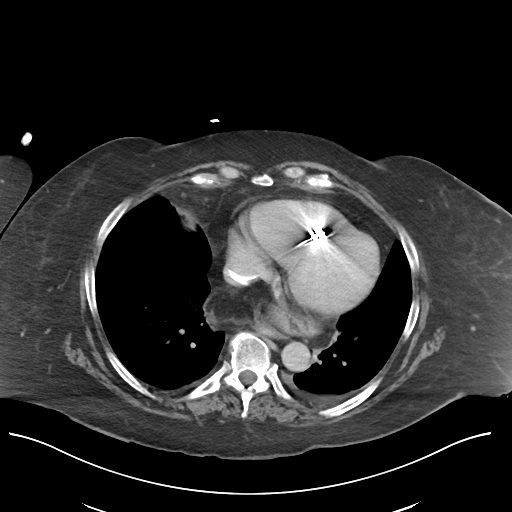

[Series 6: abdomen 3.0 mpr cor · coronal · 0.87mm/px · 3 of 124 slices shown]
[im 42/124  soft-tissue]
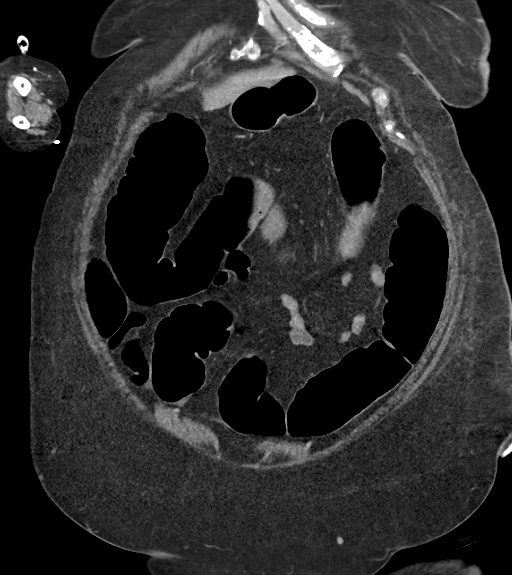
[im 55/124  soft-tissue]
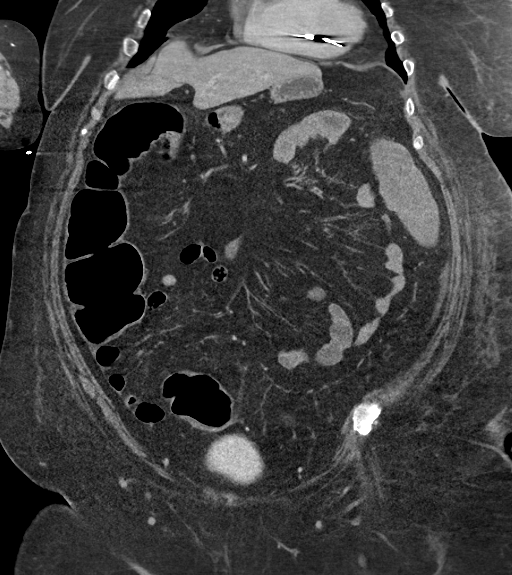
[im 69/124  soft-tissue]
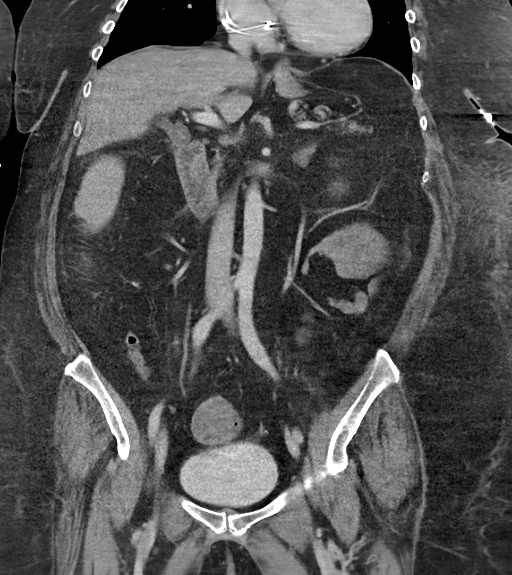

[16 of 46 positions shown; findings below may reference images not displayed]

FINDINGS: Lower chest: Lung bases demonstrate tiny pleural effusions. No focal
consolidation. Mild cardiomegaly. Partially visualized cardiac
pacing leads. Moderate hiatal hernia.

Hepatobiliary: No focal liver abnormality is seen. No gallstones,
gallbladder wall thickening, or biliary dilatation.

Pancreas: Unremarkable. No pancreatic ductal dilatation or
surrounding inflammatory changes.

Spleen: Normal in size without focal abnormality.

Adrenals/Urinary Tract: Adrenal glands are unremarkable. Kidneys are
normal, without renal calculi, focal lesion, or hydronephrosis.
Bladder is unremarkable.

Stomach/Bowel: Stomach is nonenlarged. No dilated small bowel. Mild
diffuse enlargement of the colon with fluid levels. No wall
thickening. Normal appendix.

Vascular/Lymphatic: No significant vascular findings are present. No
enlarged abdominal or pelvic lymph nodes.

Reproductive: Status post hysterectomy. No adnexal masses.

Other: Negative for free air or free fluid.

Musculoskeletal: No acute or suspicious lesion
IMPRESSION: 1. Mild diffuse enlargement of the colon which is fluid-filled,
suggesting ileus or diarrheal illness. No wall thickening. No
obstruction
2. Moderate hiatal hernia

## 2019-03-29 IMAGING — CR DG CHEST 2V
2 series · 2 of 2 positions shown · non-contrast
Comparison: None.

CLINICAL DATA: Cough

EXAM:
CHEST  2 VIEW

[chest lat]
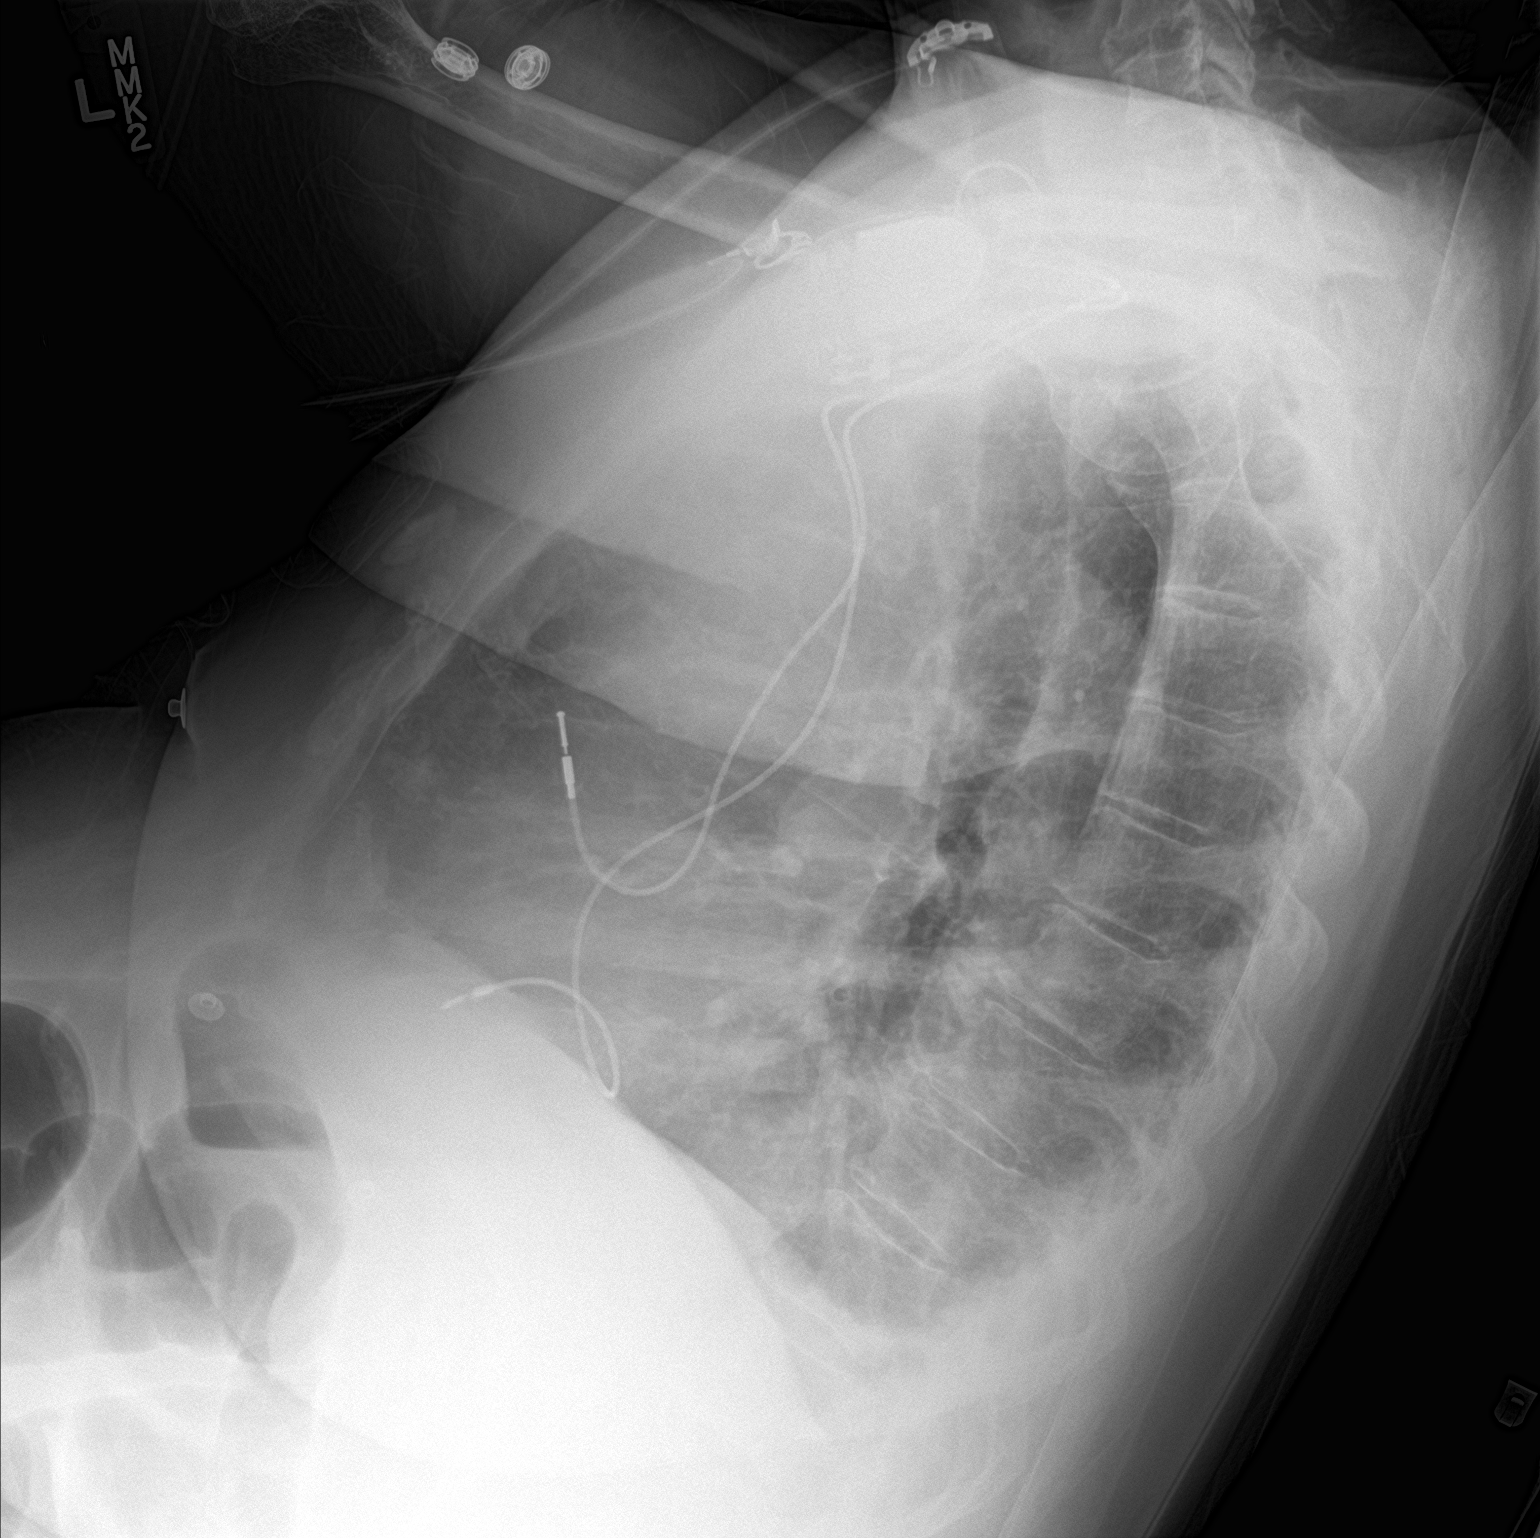

[chest ap]
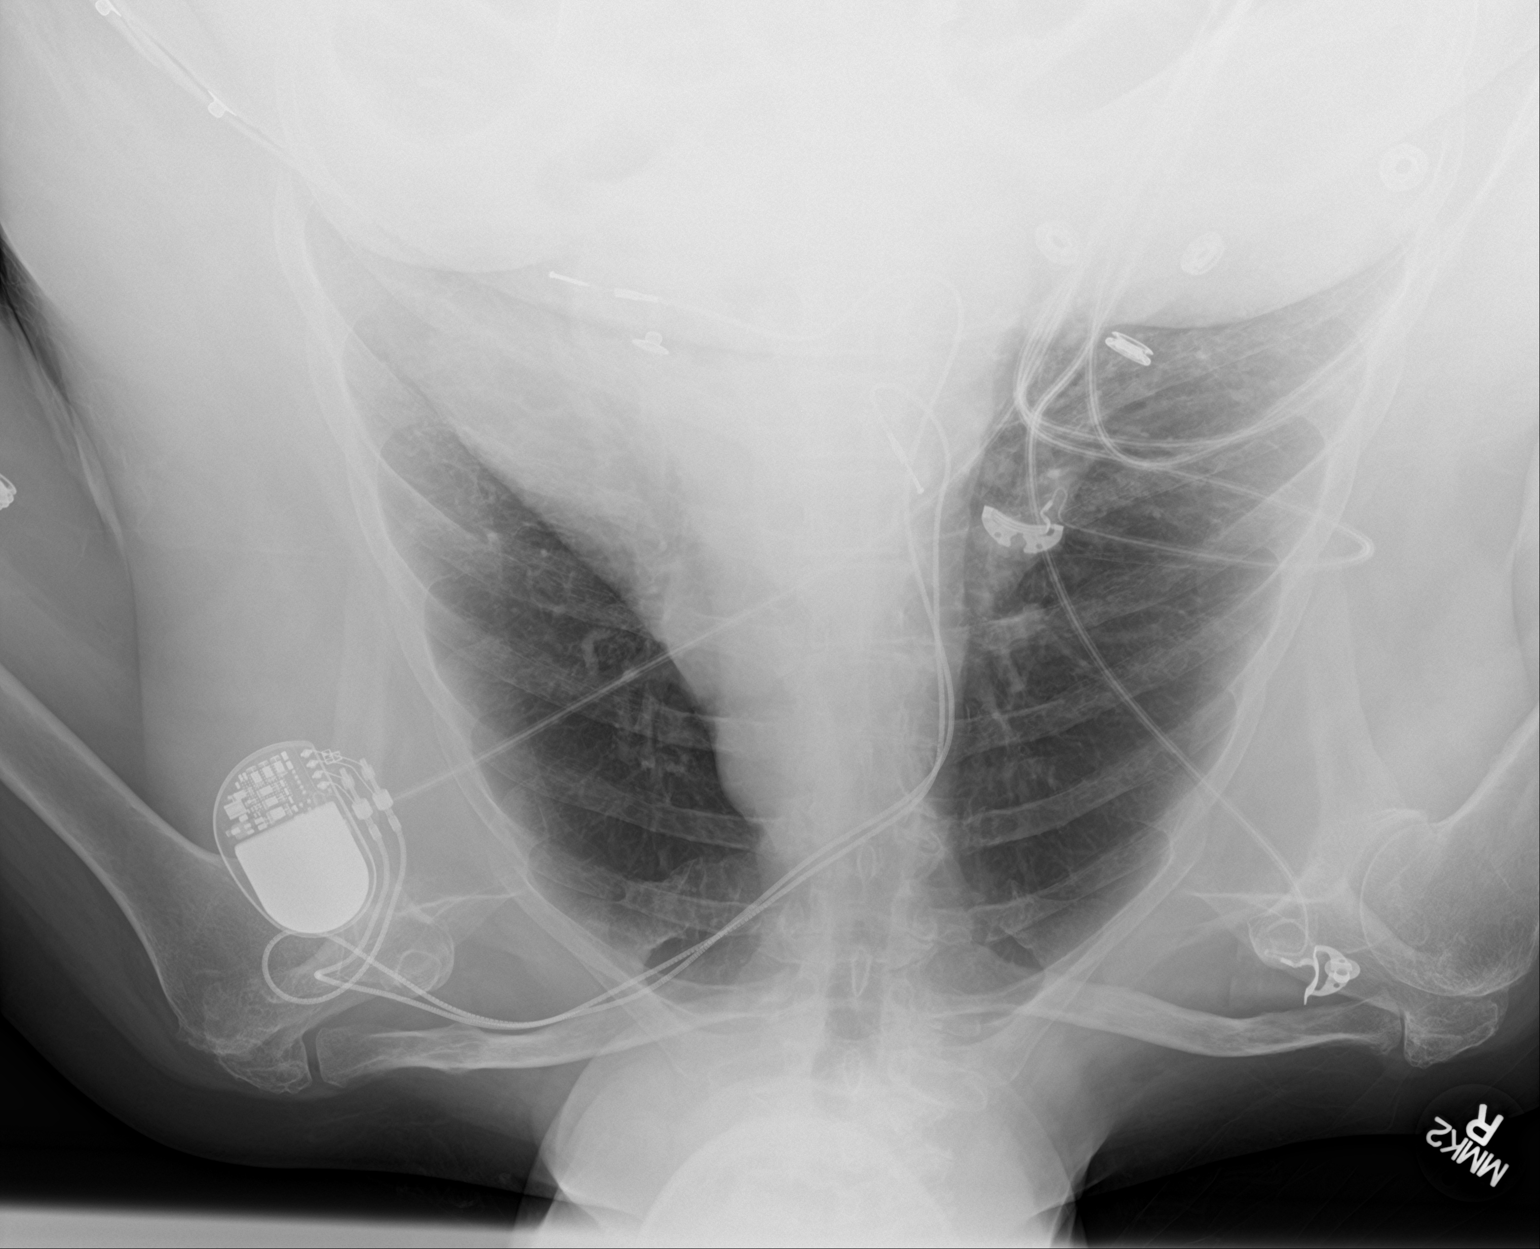

[2 of 2 positions shown; findings below may reference images not displayed]

FINDINGS: Left-sided pacing device. Borderline to mild cardiomegaly. No
consolidation. There are small pleural effusions. No pneumothorax.
Probable moderate hiatal hernia.
IMPRESSION: 1. Small bilateral effusions.
2. Borderline to mild cardiomegaly without edema
3. Probable hiatal hernia
